# Patient Record
Sex: Female | Born: 1970 | Hispanic: No | Marital: Married | Smoking: Former smoker
Health system: Southern US, Community
[De-identification: ages and names within clinical notes are randomized; demographics above are authoritative.]

## PROBLEM LIST (undated history)

## (undated) DIAGNOSIS — J45909 Unspecified asthma, uncomplicated: Secondary | ICD-10-CM

## (undated) DIAGNOSIS — R05 Cough: Secondary | ICD-10-CM

## (undated) DIAGNOSIS — T7840XA Allergy, unspecified, initial encounter: Secondary | ICD-10-CM

## (undated) DIAGNOSIS — I10 Essential (primary) hypertension: Secondary | ICD-10-CM

## (undated) DIAGNOSIS — E785 Hyperlipidemia, unspecified: Secondary | ICD-10-CM

## (undated) DIAGNOSIS — J302 Other seasonal allergic rhinitis: Secondary | ICD-10-CM

## (undated) DIAGNOSIS — R059 Cough, unspecified: Secondary | ICD-10-CM

## (undated) HISTORY — DX: Hyperlipidemia, unspecified: E78.5

## (undated) HISTORY — PX: OTHER SURGICAL HISTORY: SHX169

## (undated) HISTORY — DX: Cough, unspecified: R05.9

## (undated) HISTORY — DX: Essential (primary) hypertension: I10

## (undated) HISTORY — DX: Allergy, unspecified, initial encounter: T78.40XA

## (undated) HISTORY — DX: Other seasonal allergic rhinitis: J30.2

## (undated) HISTORY — DX: Unspecified asthma, uncomplicated: J45.909

## (undated) HISTORY — PX: WISDOM TOOTH EXTRACTION: SHX21

## (undated) HISTORY — DX: Cough: R05

---

## 2000-07-13 ENCOUNTER — Other Ambulatory Visit: Admission: RE | Admit: 2000-07-13 | Discharge: 2000-07-13 | Payer: Self-pay | Admitting: Obstetrics and Gynecology

## 2003-06-02 ENCOUNTER — Other Ambulatory Visit: Admission: RE | Admit: 2003-06-02 | Discharge: 2003-06-02 | Payer: Self-pay | Admitting: Gynecology

## 2004-06-06 ENCOUNTER — Other Ambulatory Visit: Admission: RE | Admit: 2004-06-06 | Discharge: 2004-06-06 | Payer: Self-pay | Admitting: Gynecology

## 2005-06-19 ENCOUNTER — Other Ambulatory Visit: Admission: RE | Admit: 2005-06-19 | Discharge: 2005-06-19 | Payer: Self-pay | Admitting: Gynecology

## 2006-09-13 ENCOUNTER — Other Ambulatory Visit: Admission: RE | Admit: 2006-09-13 | Discharge: 2006-09-13 | Payer: Self-pay | Admitting: Gynecology

## 2011-01-18 ENCOUNTER — Other Ambulatory Visit (HOSPITAL_COMMUNITY): Payer: Self-pay | Admitting: Obstetrics and Gynecology

## 2011-01-18 DIAGNOSIS — N979 Female infertility, unspecified: Secondary | ICD-10-CM

## 2011-01-24 ENCOUNTER — Ambulatory Visit (HOSPITAL_COMMUNITY)
Admission: RE | Admit: 2011-01-24 | Discharge: 2011-01-24 | Disposition: A | Discharge status: Home / Self Care | Payer: BLUE CROSS/BLUE SHIELD | Source: Ambulatory Visit | Attending: Obstetrics and Gynecology | Admitting: Obstetrics and Gynecology

## 2011-01-24 DIAGNOSIS — N979 Female infertility, unspecified: Secondary | ICD-10-CM | POA: Insufficient documentation

## 2011-02-09 ENCOUNTER — Other Ambulatory Visit: Payer: Self-pay | Admitting: Dermatology

## 2011-11-16 ENCOUNTER — Other Ambulatory Visit: Payer: Self-pay | Admitting: Critical Care Medicine

## 2011-11-16 ENCOUNTER — Encounter: Payer: Self-pay | Admitting: Critical Care Medicine

## 2011-11-16 ENCOUNTER — Encounter: Payer: Self-pay | Admitting: *Deleted

## 2011-11-16 ENCOUNTER — Ambulatory Visit (INDEPENDENT_AMBULATORY_CARE_PROVIDER_SITE_OTHER): Payer: BLUE CROSS/BLUE SHIELD | Admitting: Critical Care Medicine

## 2011-11-16 DIAGNOSIS — J302 Other seasonal allergic rhinitis: Secondary | ICD-10-CM

## 2011-11-16 DIAGNOSIS — J45909 Unspecified asthma, uncomplicated: Secondary | ICD-10-CM | POA: Insufficient documentation

## 2011-11-16 DIAGNOSIS — J309 Allergic rhinitis, unspecified: Secondary | ICD-10-CM

## 2011-11-16 DIAGNOSIS — I10 Essential (primary) hypertension: Secondary | ICD-10-CM

## 2011-11-16 DIAGNOSIS — R05 Cough: Secondary | ICD-10-CM

## 2011-11-16 MED ORDER — BENZONATATE 100 MG PO CAPS: ORAL_CAPSULE | ORAL | Status: AC

## 2011-11-16 MED ORDER — PREDNISONE 10 MG PO TABS: 10.0000 mg | ORAL_TABLET | Freq: Every day | ORAL | Status: DC

## 2011-11-16 MED ORDER — PREDNISONE 10 MG PO TABS: ORAL_TABLET | ORAL | Status: DC

## 2011-11-16 MED ORDER — FLUTICASONE PROPIONATE 50 MCG/ACT NA SUSP: 2.0000 | Freq: Every day | NASAL | Status: DC

## 2011-11-16 MED ORDER — BECLOMETHASONE DIPROPIONATE 80 MCG/ACT IN AERS: 2.0000 | INHALATION_SPRAY | Freq: Two times a day (BID) | RESPIRATORY_TRACT | Status: DC

## 2011-11-16 MED ORDER — CHLORPHENIRAMINE MALEATE CR 8 MG PO CPCR: 8.0000 mg | ORAL_CAPSULE | Freq: Two times a day (BID) | ORAL | Status: AC

## 2011-11-16 NOTE — Patient Instructions (Addendum)
Start Flonase two puff each nostril daily    Start cough protocol with tessalon and delsym Increase Qvar two puff twice daily Start chlorpheniramine 8mg  nightly Follow reflux diet strictly Prednisone 10mg  Take 4 for two days three for two days two for two days one for two days and stop Allergy labs today Return 4 weeks

## 2011-11-16 NOTE — Progress Notes (Signed)
Subjective:    Patient ID: Laura Nunez, female    DOB: [birth year], 41 y.o.   MRN: [id number]  HPI Comments: Chronic cough for 8years.  Comes and goes but now more constant  Cough This is a chronic problem. The current episode started more than 1 year ago. The problem has been gradually worsening. Episode frequency: comes on randomly, worse after meals in evening. The cough is non-productive. Associated symptoms include shortness of breath. Pertinent negatives include no chest pain, chills, ear congestion, ear pain, fever, headaches, heartburn, hemoptysis, myalgias, nasal congestion, postnasal drip, rash, rhinorrhea, sore throat, sweats, weight loss or wheezing. Associated symptoms comments: Dyspneic with running as cannot breath through nose Has constant sense of clearing of the throat, and is randomly. Talks a lot on phone, inside sales, takes orders on phone all day No hoarseness Occ coughs from sleep. The symptoms are aggravated by lying down, cold air and exercise (cough is worse at night.). Risk factors for lung disease include smoking/tobacco exposure. She has tried a beta-agonist inhaler and steroid inhaler for the symptoms. The treatment provided moderate relief. Her past medical history is significant for asthma. There is no history of bronchiectasis, bronchitis, COPD, emphysema, environmental allergies or pneumonia.    Past Medical History  Diagnosis Date  . High blood pressure   . Seasonal allergies   . Cough      Family History  Problem Relation Age of Onset  . Cancer Mother   . Hypertension Father   . Diabetes Father   . Stroke Paternal Uncle   . Asthma      paternal cousin, cause of death at age 35     History   Social History  . Marital Status: Married    Spouse Name: N/A    Number of Children: 0  . Years of Education: N/A   Occupational History  . Inside Sales    Social History Main Topics  . Smoking status: Former Smoker -- 5 years    Types:  Cigarettes    Quit date: 10/09/1998  . Smokeless tobacco: Never Used   Comment: 7 ciggs a day  . Alcohol Use: Yes  . Drug Use: No  . Sexually Active: Not on file   Other Topics Concern  . Not on file   Social History Narrative  . No narrative on file     Allergies  Allergen Reactions  . Sulfa Antibiotics     Lower white blood count, rash     No outpatient prescriptions prior to visit.      Review of Systems  Constitutional: Positive for fatigue. Negative for fever, chills, weight loss, diaphoresis, activity change, appetite change and unexpected weight change.  HENT: Positive for sneezing. Negative for hearing loss, ear pain, nosebleeds, congestion, sore throat, facial swelling, rhinorrhea, mouth sores, trouble swallowing, neck pain, neck stiffness, dental problem, voice change, postnasal drip, sinus pressure, tinnitus and ear discharge.   Eyes: Negative for photophobia, discharge, itching and visual disturbance.  Respiratory: Positive for cough and shortness of breath. Negative for apnea, hemoptysis, choking, chest tightness, wheezing and stridor.   Cardiovascular: Negative for chest pain, palpitations and leg swelling.  Gastrointestinal: Negative for heartburn, nausea, vomiting, abdominal pain, constipation, blood in stool and abdominal distention.  Genitourinary: Negative for dysuria, urgency, frequency, hematuria, flank pain, decreased urine volume and difficulty urinating.  Musculoskeletal: Negative for myalgias, back pain, joint swelling, arthralgias and gait problem.  Skin: Negative for color change, pallor and rash.  Neurological: Negative for dizziness, tremors,  seizures, syncope, speech difficulty, weakness, light-headedness, numbness and headaches.  Hematological: Negative for environmental allergies and adenopathy. Bruises/bleeds easily.  Psychiatric/Behavioral: Negative for confusion, sleep disturbance and agitation. The patient is not nervous/anxious.          Objective:   Physical Exam Filed Vitals:   11/16/11 1030  BP: 156/102  Pulse: 89  Temp: 98.2 F (36.8 C)  TempSrc: Oral  Height: 5' 3.5" (1.613 m)  Weight: 160 lb (72.576 kg)  SpO2: 99%    Gen: Pleasant, well-nourished, in no distress,  normal affect  ENT: No lesions,  mouth clear,  oropharynx clear, +++ postnasal drip, turbinate edema  Neck: No JVD, no TMG, no carotid bruits  Lungs: No use of accessory muscles, no dullness to percussion, clear without rales or rhonchi  Cardiovascular: RRR, heart sounds normal, no murmur or gallops, no peripheral edema  Abdomen: soft and NT, no HSM,  BS normal  Musculoskeletal: No deformities, no cyanosis or clubbing  Neuro: alert, non focal  Skin: Warm, no lesions or rashes  CXR reported normal 10/2011  11/16/11 Spirometry: normal.  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 Prob atopic factors  Plan Start Flonase two puff each nostril daily    Start cough protocol with tessalon and delsym Increase Qvar two puff twice daily Start chlorpheniramine 8mg  nightly Follow reflux diet strictly Prednisone 10mg  Take 4 for two days three for two days two for two days one for two days and stop Allergy labs today Return 4 weeks     Updated Medication List Outpatient Encounter Prescriptions as of 11/16/2011  Medication Sig Dispense Refill  . beclomethasone (QVAR) 80 MCG/ACT inhaler Inhale 2 puffs into the lungs 2 (two) times daily.  1 Inhaler  6  . hydrochlorothiazide (HYDRODIURIL) 12.5 MG tablet Take 1 tablet by mouth Daily.      Marland Kitchen letrozole (FEMARA) 2.5 MG tablet Starts day 3 of her monthly cycle and takes for 5 days      . PROAIR HFA 108 (90 BASE) MCG/ACT inhaler Inhale 2 puffs into the lungs Ad lib.      Marland Kitchen DISCONTD: QVAR 80 MCG/ACT inhaler Inhale 2  puffs into the lungs Daily.      . benzonatate (TESSALON) 100 MG capsule Take per cyclic cough protocol 1-2 every 4 hours as needed for cough  90 capsule  4  . chlorpheniramine (CHLOR-TRIMETON) 8 MG capsule Take 1 capsule (8 mg total) by mouth 2 (two) times daily.  90 capsule  6  . fluticasone (FLONASE) 50 MCG/ACT nasal spray Place 2 sprays into the nose daily.  16 g  6  . predniSONE (DELTASONE) 10 MG tablet Take 4 for two days three for two days two for two days one for two days  20 tablet  0  . DISCONTD: predniSONE (DELTASONE) 10 MG tablet Take 1 tablet (10 mg total) by mouth daily.  10 tablet  0

## 2011-11-16 NOTE — Assessment & Plan Note (Signed)
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 Prob atopic factors  Plan Start Flonase two puff each nostril daily    Start cough protocol with tessalon and delsym Increase Qvar two puff twice daily Start chlorpheniramine 8mg  nightly Follow reflux diet strictly Prednisone 10mg  Take 4 for two days three for two days two for two days one for two days and stop Allergy labs today Return 4 weeks

## 2011-11-17 LAB — ALLERGY FULL PROFILE
Allergen,Goose feathers, e70: <0.1 kU/L (ref <0.35)
Box Elder IgE: <0.1 kU/L (ref <0.35)
Common Ragweed: <0.1 kU/L (ref <0.35)
Curvularia lunata: <0.1 kU/L (ref <0.35)
Dog Dander: 0.26 kU/L (ref <0.35)
Fescue: <0.1 kU/L (ref <0.35)
G005 Rye, Perennial: <0.1 kU/L (ref <0.35)
Goldenrod: <0.1 kU/L (ref <0.35)
Helminthosporium halodes: <0.1 kU/L (ref <0.35)
House Dust Hollister: 0.29 kU/L (ref <0.35)
IgE (Immunoglobulin E), Serum: 17.4 IU/mL (ref 0.0–180.0)
Lamb's Quarters: <0.1 kU/L (ref <0.35)
Stemphylium Botryosum: <0.1 kU/L (ref <0.35)

## 2011-11-17 NOTE — Progress Notes (Signed)
Quick Note:  Called, spoke with pt. I informed her labs are ok; no evidence of allergy reactions per PW. She verbalized understanding of this and voiced no further questions at this time. ______

## 2011-12-21 ENCOUNTER — Encounter: Payer: Self-pay | Admitting: Critical Care Medicine

## 2011-12-21 ENCOUNTER — Ambulatory Visit (INDEPENDENT_AMBULATORY_CARE_PROVIDER_SITE_OTHER): Payer: BC Managed Care – PPO | Admitting: Critical Care Medicine

## 2011-12-21 VITALS — BP 146/88 | HR 92 | Temp 98.1°F | Ht 63.5 in | Wt 159.0 lb

## 2011-12-21 DIAGNOSIS — R05 Cough: Secondary | ICD-10-CM

## 2011-12-21 MED ORDER — BECLOMETHASONE DIPROPIONATE 80 MCG/ACT IN AERS: 2.0000 | INHALATION_SPRAY | Freq: Every day | RESPIRATORY_TRACT | Status: DC

## 2011-12-21 NOTE — Progress Notes (Signed)
Subjective:    Patient ID: Laura Nunez, female    DOB: 10/11/1970, 41 y.o.   MRN: [id number]  HPI 12/21/2011 Cough is better.  When does  Cough is productive, not out.  Still wants to clear the throat. tart Flonase two puff each nostril daily    Start cough protocol with tessalon and delsym Increase Qvar two puff twice daily Start chlorpheniramine 8mg  nightly Follow reflux diet strictly Prednisone 10mg  Take 4 for two days three for two days two for two days one for two days and stop Allergy labs today  Past Medical History  Diagnosis Date  . High blood pressure   . Seasonal allergies   . Cough      Family History  Problem Relation Age of Onset  . Cancer Mother   . Hypertension Father   . Diabetes Father   . Stroke Paternal Uncle   . Asthma      paternal cousin, cause of death at age 84     History   Social History  . Marital Status: Married    Spouse Name: N/A    Number of Children: 0  . Years of Education: N/A   Occupational History  . Inside Sales    Social History Main Topics  . Smoking status: Former Smoker -- 5 years    Types: Cigarettes    Quit date: 10/09/1998  . Smokeless tobacco: Never Used   Comment: 7 ciggs a day  . Alcohol Use: Yes  . Drug Use: No  . Sexually Active: Not on file   Other Topics Concern  . Not on file   Social History Narrative  . No narrative on file     Allergies  Allergen Reactions  . Sulfa Antibiotics     Lower white blood count, rash     Outpatient Prescriptions Prior to Visit  Medication Sig Dispense Refill  . fluticasone (FLONASE) 50 MCG/ACT nasal spray Place 2 sprays into the nose daily.  16 g  6  . hydrochlorothiazide (HYDRODIURIL) 12.5 MG tablet Take 1 tablet by mouth Daily.      Marland Kitchen PROAIR HFA 108 (90 BASE) MCG/ACT inhaler Inhale 2 puffs into the lungs Ad lib.      . beclomethasone (QVAR) 80 MCG/ACT inhaler Inhale 2 puffs into the lungs 2 (two) times daily.  1 Inhaler  6  . letrozole (FEMARA) 2.5 MG  tablet Starts day 3 of her monthly cycle and takes for 5 days      . predniSONE (DELTASONE) 10 MG tablet Take 4 for two days three for two days two for two days one for two days  20 tablet  0      Review of Systems  Constitutional: Positive for fatigue. Negative for diaphoresis, activity change, appetite change and unexpected weight change.  HENT: Positive for sneezing. Negative for hearing loss, nosebleeds, congestion, facial swelling, mouth sores, trouble swallowing, neck pain, neck stiffness, dental problem, voice change, sinus pressure, tinnitus and ear discharge.   Eyes: Negative for photophobia, discharge, itching and visual disturbance.  Respiratory: Negative for apnea, choking, chest tightness and stridor.   Cardiovascular: Negative for palpitations and leg swelling.  Gastrointestinal: Negative for nausea, vomiting, abdominal pain, constipation, blood in stool and abdominal distention.  Genitourinary: Negative for dysuria, urgency, frequency, hematuria, flank pain, decreased urine volume and difficulty urinating.  Musculoskeletal: Negative for back pain, joint swelling, arthralgias and gait problem.  Skin: Negative for color change and pallor.  Neurological: Negative for dizziness, tremors, seizures, syncope,  speech difficulty, weakness, light-headedness and numbness.  Hematological: Negative for adenopathy. Bruises/bleeds easily.  Psychiatric/Behavioral: Negative for confusion, sleep disturbance and agitation. The patient is not nervous/anxious.        Objective:   Physical Exam  Filed Vitals:   12/21/11 0953  BP: 146/88  Pulse: 92  Temp: 98.1 F (36.7 C)  TempSrc: Oral  Height: 5' 3.5" (1.613 m)  Weight: 159 lb (72.122 kg)  SpO2: 99%    Gen: Pleasant, well-nourished, in no distress,  normal affect  ENT: No lesions,  mouth clear,  oropharynx clear, + postnasal drip, turbinate edema  Neck: No JVD, no TMG, no carotid bruits  Lungs: No use of accessory muscles, no  dullness to percussion, clear without rales or rhonchi  Cardiovascular: RRR, heart sounds normal, no murmur or gallops, no peripheral edema  Abdomen: soft and NT, no HSM,  BS normal  Musculoskeletal: No deformities, no cyanosis or clubbing  Neuro: alert, non focal  Skin: Warm, no lesions or rashes  CXR reported normal 10/2011  11/16/11 Spirometry: normal.  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 beclomethasone (QVAR) 80 MCG/ACT inhaler Inhale 2 puffs into the lungs daily.  1 Inhaler  6  . benzonatate (TESSALON) 100 MG capsule Take 100 mg by mouth as needed.      . fluticasone (FLONASE) 50 MCG/ACT nasal spray Place 2 sprays into the nose daily.  16 g  6  . hydrochlorothiazide (HYDRODIURIL) 12.5 MG tablet Take 1 tablet by mouth Daily.      Marland Kitchen PROAIR HFA 108 (90 BASE) MCG/ACT inhaler Inhale 2 puffs into the lungs Ad lib.      Marland Kitchen DISCONTD: beclomethasone (QVAR) 80 MCG/ACT inhaler Inhale 2 puffs into the lungs 2 (two) times daily.  1 Inhaler  6  . letrozole (FEMARA) 2.5 MG tablet Starts day 3 of her monthly cycle and takes for 5 days      . DISCONTD: benzonatate (TESSALON) 100 MG capsule       . DISCONTD: predniSONE (DELTASONE) 10 MG tablet Take 4 for two days three for two days two for two days one for two days  20 tablet  0

## 2011-12-21 NOTE — Assessment & Plan Note (Signed)
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

## 2011-12-21 NOTE — Patient Instructions (Addendum)
Reduce Qvar to once daily two puff Stay on flonase daily Tessalon as needed Voice rest when you can Continue the sugar free candy drops >>>train self to swallow not cough or clear the throat Return 4 months for recheck

## 2012-06-13 ENCOUNTER — Ambulatory Visit: Payer: BC Managed Care – PPO | Admitting: Critical Care Medicine

## 2012-06-27 ENCOUNTER — Ambulatory Visit: Payer: BC Managed Care – PPO | Admitting: Critical Care Medicine

## 2012-07-11 ENCOUNTER — Ambulatory Visit (INDEPENDENT_AMBULATORY_CARE_PROVIDER_SITE_OTHER): Payer: BC Managed Care – PPO | Admitting: Critical Care Medicine

## 2012-07-11 ENCOUNTER — Encounter: Payer: Self-pay | Admitting: Critical Care Medicine

## 2012-07-11 VITALS — BP 110/80 | HR 82 | Temp 99.1°F | Ht 63.5 in | Wt 155.0 lb

## 2012-07-11 DIAGNOSIS — R05 Cough: Secondary | ICD-10-CM

## 2012-07-11 MED ORDER — BECLOMETHASONE DIPROPIONATE 80 MCG/ACT IN AERS: 2.0000 | INHALATION_SPRAY | Freq: Every day | RESPIRATORY_TRACT | Status: AC

## 2012-07-11 MED ORDER — FLUTICASONE PROPIONATE 50 MCG/ACT NA SUSP: 2.0000 | Freq: Every day | NASAL | Status: DC

## 2012-07-11 NOTE — Progress Notes (Signed)
Subjective:    Patient ID: Laura Nunez, female    DOB: [birth year], 41 y.o.   MRN: [id number]  HPI  07/11/2012 No recurrence of cough since last ov.  Allergy flare two weeks ago and rx mucinex and augmentin. Pt is markedly better overall. Pt denies any significant sore throat, nasal congestion or excess secretions, fever, chills, sweats, unintended weight loss, pleurtic or exertional chest pain, orthopnea PND, or leg swelling Pt denies any increase in rescue therapy over baseline, denies waking up needing it or having any early am or nocturnal exacerbations of coughing/wheezing/or dyspnea. Pt also denies any obvious fluctuation in symptoms with  weather or environmental change or other alleviating or aggravating factors     Past Medical History  Diagnosis Date  . High blood pressure   . Seasonal allergies   . Cough      Family History  Problem Relation Age of Onset  . Cancer Mother   . Hypertension Father   . Diabetes Father   . Stroke Paternal Uncle   . Asthma      paternal cousin, cause of death at age 80     History   Social History  . Marital Status: Married    Spouse Name: N/A    Number of Children: 0  . Years of Education: N/A   Occupational History  . Inside Sales    Social History Main Topics  . Smoking status: Former Smoker -- 5 years    Types: Cigarettes    Quit date: 10/09/1998  . Smokeless tobacco: Never Used   Comment: 7 cigs a day  . Alcohol Use: Yes  . Drug Use: No  . Sexually Active: Not on file   Other Topics Concern  . Not on file   Social History Narrative  . No narrative on file     Allergies  Allergen Reactions  . Sulfa Antibiotics     Lower white blood count, rash     Outpatient Prescriptions Prior to Visit  Medication Sig Dispense Refill  . hydrochlorothiazide (HYDRODIURIL) 12.5 MG tablet Take 1 tablet by mouth Daily.      Marland Kitchen PROAIR HFA 108 (90 BASE) MCG/ACT inhaler Inhale 2 puffs into the lungs Ad lib.      .  beclomethasone (QVAR) 80 MCG/ACT inhaler Inhale 2 puffs into the lungs daily.  1 Inhaler  6  . fluticasone (FLONASE) 50 MCG/ACT nasal spray Place 2 sprays into the nose daily.  16 g  6  . benzonatate (TESSALON) 100 MG capsule Take 100 mg by mouth as needed.      Marland Kitchen letrozole (FEMARA) 2.5 MG tablet Starts day 3 of her monthly cycle and takes for 5 days          Review of Systems  Constitutional: Positive for fatigue. Negative for diaphoresis, activity change, appetite change and unexpected weight change.  HENT: Positive for sneezing. Negative for hearing loss, nosebleeds, congestion, facial swelling, mouth sores, trouble swallowing, neck pain, neck stiffness, dental problem, voice change, sinus pressure, tinnitus and ear discharge.   Eyes: Negative for photophobia, discharge, itching and visual disturbance.  Respiratory: Negative for apnea, choking, chest tightness and stridor.   Cardiovascular: Negative for palpitations and leg swelling.  Gastrointestinal: Negative for nausea, vomiting, abdominal pain, constipation, blood in stool and abdominal distention.  Genitourinary: Negative for dysuria, urgency, frequency, hematuria, flank pain, decreased urine volume and difficulty urinating.  Musculoskeletal: Negative for back pain, joint swelling, arthralgias and gait problem.  Skin: Negative for color  change and pallor.  Neurological: Negative for dizziness, tremors, seizures, syncope, speech difficulty, weakness, light-headedness and numbness.  Hematological: Negative for adenopathy. Bruises/bleeds easily.  Psychiatric/Behavioral: Negative for confusion, disturbed wake/sleep cycle and agitation. The patient is not nervous/anxious.        Objective:   Physical Exam  Filed Vitals:   07/11/12 1534  BP: 110/80  Pulse: 82  Temp: 99.1 F (37.3 C)  TempSrc: Oral  Height: 5' 3.5" (1.613 m)  Weight: 70.308 kg (155 lb)  SpO2: 99%    Gen: Pleasant, well-nourished, in no distress,  normal  affect  ENT: No lesions,  mouth clear,  oropharynx clear, + postnasal drip  Neck: No JVD, no TMG, no carotid bruits  Lungs: No use of accessory muscles, no dullness to percussion, clear without rales or rhonchi  Cardiovascular: RRR, heart sounds normal, no murmur or gallops, no peripheral edema  Abdomen: soft and NT, no HSM,  BS normal  Musculoskeletal: No deformities, no cyanosis or clubbing  Neuro: alert, non focal  Skin: Warm, no lesions or rashes       Assessment & Plan:   Cough Cyclic cough d/t lower airway inflammation and moderate persistent asthma, improved.  also a ppt factor is  allergic rhinitis Plan Qvar two puff once daily Flonase daily Return 6 months     Updated Medication List Outpatient Encounter Prescriptions as of 07/11/2012  Medication Sig Dispense Refill  . beclomethasone (QVAR) 80 MCG/ACT inhaler Inhale 2 puffs into the lungs daily.  1 Inhaler  11  . fluticasone (FLONASE) 50 MCG/ACT nasal spray Place 2 sprays into the nose daily.  16 g  11  . hydrochlorothiazide (HYDRODIURIL) 12.5 MG tablet Take 1 tablet by mouth Daily.      Marland Kitchen PROAIR HFA 108 (90 BASE) MCG/ACT inhaler Inhale 2 puffs into the lungs Ad lib.      Marland Kitchen DISCONTD: beclomethasone (QVAR) 80 MCG/ACT inhaler Inhale 2 puffs into the lungs daily.  1 Inhaler  6  . DISCONTD: fluticasone (FLONASE) 50 MCG/ACT nasal spray Place 2 sprays into the nose daily.  16 g  6  . DISCONTD: benzonatate (TESSALON) 100 MG capsule Take 100 mg by mouth as needed.      Marland Kitchen DISCONTD: letrozole (FEMARA) 2.5 MG tablet Starts day 3 of her monthly cycle and takes for 5 days

## 2012-07-11 NOTE — Patient Instructions (Addendum)
Qvar two puff once daily Flonase daily Return 6 months

## 2012-07-14 NOTE — Assessment & Plan Note (Signed)
Cyclic cough d/t lower airway inflammation and moderate persistent asthma, improved.  also a ppt factor is  allergic rhinitis Plan Qvar two puff once daily Flonase daily Return 6 months

## 2013-01-23 ENCOUNTER — Ambulatory Visit (INDEPENDENT_AMBULATORY_CARE_PROVIDER_SITE_OTHER): Payer: BC Managed Care – PPO | Admitting: Critical Care Medicine

## 2013-01-23 ENCOUNTER — Encounter: Payer: Self-pay | Admitting: Critical Care Medicine

## 2013-01-23 VITALS — BP 142/100 | HR 79 | Temp 98.2°F | Ht 63.5 in | Wt 160.5 lb

## 2013-01-23 DIAGNOSIS — J45909 Unspecified asthma, uncomplicated: Secondary | ICD-10-CM

## 2013-01-23 NOTE — Patient Instructions (Addendum)
Stay on Qvar and flonase You can try chlorpheniramine (chlor trimeton) 4mg - 8mg  at bedtime for drainage and then use claritin/zyrtec/allegra each AM Return 6 months

## 2013-01-23 NOTE — Progress Notes (Signed)
Subjective:    Patient ID: Laura Nunez, female    DOB: 11/22/1970, 42 y.o.   MRN: [id number]  HPI  01/23/2013 Cough is better, may have irritated when running with throat scratchy. SOme pndrip.  Pt denies any significant sore throat, nasal congestion or excess secretions, fever, chills, sweats, unintended weight loss, pleurtic or exertional chest pain, orthopnea PND, or leg swelling Pt denies any increase in rescue therapy over baseline, denies waking up needing it or having any early am or nocturnal exacerbations of coughing/wheezing/or dyspnea. Pt also denies any obvious fluctuation in symptoms with  weather or environmental change or other alleviating or aggravating factors  PUL ASTHMA HISTORY 01/23/2013  Symptoms >2 days/week  Nighttime awakenings 3-4/month  Interference with activity No limitations  SABA use 0-2 days/wk  Exacerbations requiring oral steroids 0-1 / year      Past Medical History  Diagnosis Date  . High blood pressure   . Seasonal allergies   . Cough      Family History  Problem Relation Age of Onset  . Cancer Mother   . Hypertension Father   . Diabetes Father   . Stroke Paternal Uncle   . Asthma      paternal cousin, cause of death at age 60     History   Social History  . Marital Status: Married    Spouse Name: N/A    Number of Children: 0  . Years of Education: N/A   Occupational History  . [employer]    Social History Main Topics  . Smoking status: Former Smoker -- 0.75 packs/day for 5 years    Types: Cigarettes    Quit date: 10/09/1998  . Smokeless tobacco: Never Used     Comment: 7 cigs a day  . Alcohol Use: Yes  . Drug Use: No  . Sexually Active: Not on file   Other Topics Concern  . Not on file   Social History Narrative  . No narrative on file     Allergies  Allergen Reactions  . Sulfa Antibiotics     Lower white blood count, rash     Outpatient Prescriptions Prior to Visit  Medication Sig Dispense Refill   . beclomethasone (QVAR) 80 MCG/ACT inhaler Inhale 2 puffs into the lungs daily.  1 Inhaler  11  . fluticasone (FLONASE) 50 MCG/ACT nasal spray Place 2 sprays into the nose daily.  16 g  11  . hydrochlorothiazide (HYDRODIURIL) 12.5 MG tablet Take 1 tablet by mouth Daily.      Marland Kitchen PROAIR HFA 108 (90 BASE) MCG/ACT inhaler Inhale 2 puffs into the lungs Ad lib.       No facility-administered medications prior to visit.      Review of Systems  Constitutional: Positive for fatigue. Negative for diaphoresis, activity change, appetite change and unexpected weight change.  HENT: Positive for sneezing. Negative for hearing loss, nosebleeds, congestion, facial swelling, mouth sores, trouble swallowing, neck pain, neck stiffness, dental problem, voice change, sinus pressure, tinnitus and ear discharge.   Eyes: Negative for photophobia, discharge, itching and visual disturbance.  Respiratory: Negative for apnea, choking, chest tightness and stridor.   Cardiovascular: Negative for palpitations and leg swelling.  Gastrointestinal: Negative for nausea, vomiting, abdominal pain, constipation, blood in stool and abdominal distention.  Genitourinary: Negative for dysuria, urgency, frequency, hematuria, flank pain, decreased urine volume and difficulty urinating.  Musculoskeletal: Negative for back pain, joint swelling, arthralgias and gait problem.  Skin: Negative for color change and pallor.  Neurological: Negative for dizziness, tremors, seizures, syncope, speech difficulty, weakness, light-headedness and numbness.  Hematological: Negative for adenopathy. Bruises/bleeds easily.  Psychiatric/Behavioral: Negative for confusion, sleep disturbance and agitation. The patient is not nervous/anxious.        Objective:   Physical Exam  Filed Vitals:   01/23/13 0950  BP: 142/100  Pulse: 79  Temp: 98.2 F (36.8 C)  TempSrc: Oral  Height: 5' 3.5" (1.613 m)  Weight: 160 lb 8 oz (72.802 kg)  SpO2: 99%     Gen: Pleasant, well-nourished, in no distress,  normal affect  ENT: No lesions,  mouth clear,  oropharynx clear, + postnasal drip  Neck: No JVD, no TMG, no carotid bruits  Lungs: No use of accessory muscles, no dullness to percussion, clear without rales or rhonchi  Cardiovascular: RRR, heart sounds normal, no murmur or gallops, no peripheral edema  Abdomen: soft and NT, no HSM,  BS normal  Musculoskeletal: No deformities, no cyanosis or clubbing  Neuro: alert, non focal  Skin: Warm, no lesions or rashes     Assessment & Plan:   Moderate persistent asthma with atopic features Moderate persistent asthma with significant allergic features and cyclical cough now improved Plan Stay on Qvar and flonase  chlorpheniramine (chlor trimeton) 4mg - 8mg  at bedtime for drainage and then use claritin/zyrtec/allegra each AM Return 6 months     Updated Medication List Outpatient Encounter Prescriptions as of 01/23/2013  Medication Sig Dispense Refill  . beclomethasone (QVAR) 80 MCG/ACT inhaler Inhale 2 puffs into the lungs daily.  1 Inhaler  11  . fluticasone (FLONASE) 50 MCG/ACT nasal spray Place 2 sprays into the nose daily.  16 g  11  . hydrochlorothiazide (HYDRODIURIL) 12.5 MG tablet Take 1 tablet by mouth Daily.      Marland Kitchen loratadine (CLARITIN) 10 MG tablet Take 10 mg by mouth daily as needed.       Marland Kitchen PROAIR HFA 108 (90 BASE) MCG/ACT inhaler Inhale 2 puffs into the lungs Ad lib.       No facility-administered encounter medications on file as of 01/23/2013.

## 2013-01-23 NOTE — Assessment & Plan Note (Signed)
Moderate persistent asthma with significant allergic features and cyclical cough now improved Plan Stay on Qvar and flonase  chlorpheniramine (chlor trimeton) 4mg - 8mg  at bedtime for drainage and then use claritin/zyrtec/allegra each AM Return 6 months

## 2013-07-16 ENCOUNTER — Other Ambulatory Visit: Payer: Self-pay | Admitting: *Deleted

## 2013-07-16 MED ORDER — FLUTICASONE PROPIONATE 50 MCG/ACT NA SUSP: 2.0000 | Freq: Every day | NASAL | Status: DC

## 2013-08-11 ENCOUNTER — Telehealth: Payer: Self-pay | Admitting: Critical Care Medicine

## 2013-08-11 NOTE — Telephone Encounter (Signed)
Called Patient to set up follow up apt, Left message x3. No return call back. Sent letter 08/11/13  ° °

## 2013-08-31 ENCOUNTER — Other Ambulatory Visit: Payer: Self-pay | Admitting: Critical Care Medicine

## 2013-09-01 NOTE — Telephone Encounter (Signed)
Flonase rx sent to [facility] on 07/16/13 # 16g x 4. [employer], spoke with [place].  They did receive the rx in October. Nothing further needed from our office at this time.

## 2015-10-24 ENCOUNTER — Emergency Department (HOSPITAL_COMMUNITY)
Admission: EM | Admit: 2015-10-24 | Discharge: 2015-10-24 | Disposition: A | Discharge status: Home / Self Care | Payer: BLUE CROSS/BLUE SHIELD | Attending: Emergency Medicine | Admitting: Emergency Medicine

## 2015-10-24 ENCOUNTER — Encounter (HOSPITAL_COMMUNITY): Payer: Self-pay | Admitting: Emergency Medicine

## 2015-10-24 ENCOUNTER — Emergency Department (HOSPITAL_COMMUNITY): Payer: BLUE CROSS/BLUE SHIELD

## 2015-10-24 DIAGNOSIS — Z8709 Personal history of other diseases of the respiratory system: Secondary | ICD-10-CM | POA: Insufficient documentation

## 2015-10-24 DIAGNOSIS — Z7952 Long term (current) use of systemic steroids: Secondary | ICD-10-CM | POA: Insufficient documentation

## 2015-10-24 DIAGNOSIS — Z87891 Personal history of nicotine dependence: Secondary | ICD-10-CM | POA: Insufficient documentation

## 2015-10-24 DIAGNOSIS — S9031XA Contusion of right foot, initial encounter: Secondary | ICD-10-CM | POA: Diagnosis not present

## 2015-10-24 DIAGNOSIS — Y92009 Unspecified place in unspecified non-institutional (private) residence as the place of occurrence of the external cause: Secondary | ICD-10-CM | POA: Diagnosis not present

## 2015-10-24 DIAGNOSIS — Y9389 Activity, other specified: Secondary | ICD-10-CM | POA: Insufficient documentation

## 2015-10-24 DIAGNOSIS — Z7951 Long term (current) use of inhaled steroids: Secondary | ICD-10-CM | POA: Insufficient documentation

## 2015-10-24 DIAGNOSIS — Y999 Unspecified external cause status: Secondary | ICD-10-CM | POA: Diagnosis not present

## 2015-10-24 DIAGNOSIS — I10 Essential (primary) hypertension: Secondary | ICD-10-CM | POA: Diagnosis not present

## 2015-10-24 DIAGNOSIS — S99911A Unspecified injury of right ankle, initial encounter: Secondary | ICD-10-CM | POA: Diagnosis not present

## 2015-10-24 DIAGNOSIS — W500XXA Accidental hit or strike by another person, initial encounter: Secondary | ICD-10-CM | POA: Insufficient documentation

## 2015-10-24 DIAGNOSIS — Z79899 Other long term (current) drug therapy: Secondary | ICD-10-CM | POA: Insufficient documentation

## 2015-10-24 DIAGNOSIS — M79671 Pain in right foot: Secondary | ICD-10-CM

## 2015-10-24 NOTE — Discharge Instructions (Signed)
You were evaluated in the ED today for your right foot pain. Your x-ray was negative for any broken bones or dislocations. You are likely experiencing a sprain. You may continue using Motrin for your swelling and discomfort. You may also use an Ace wrap while active. Follow-up with your doctor as needed for reevaluation. Return to ED for any new or worsening symptoms.

## 2015-10-24 NOTE — ED Notes (Signed)
Pt reports that visitor was drinking last night and fell onto her right foot causing pain to foot and ankle.

## 2015-10-24 NOTE — ED Provider Notes (Signed)
CSN: [id number]     Arrival date & time 10/24/15  [id number] History  By signing my name below, I, Irene Pap, attest that this documentation has been prepared under the direction and in the presence of [employer], PA-C. Electronically Signed: Irene Pap, ED Scribe. 10/24/2015. 11:46 AM.   Chief Complaint  Patient presents with  . Foot Injury  . Ankle Injury   The history is provided by the patient. No language interpreter was used.  HPI Comments: Laura Nunez is a 45 y.o. Female with a hx of HTN who presents to the Emergency Department complaining of a right ankle injury onset one day ago. Pt states that she and her husband were drinking at the house when he fell backwards and landed on the anterior portion of her right lower leg. She reports worsening pain with movement and palpation of the medial portion of the right ankle. She reports associated swelling. Pt has not taken anything for her pain. She denies knee pain, numbness or weakness. Pt denies use of blood thinners.   Past Medical History  Diagnosis Date  . High blood pressure   . Seasonal allergies   . Cough    Past Surgical History  Procedure Laterality Date  . Wisdom tooth extraction     Family History  Problem Relation Age of Onset  . Cancer Mother   . Hypertension Father   . Diabetes Father   . Stroke Paternal Uncle   . Asthma      paternal cousin, cause of death at age 91   Social History  Substance Use Topics  . Smoking status: Former Smoker -- 0.75 packs/day for 5 years    Types: Cigarettes    Quit date: 10/09/1998  . Smokeless tobacco: Never Used     Comment: 7 cigs a day  . Alcohol Use: Yes   OB History    No data available     Review of Systems 10 Systems reviewed and all are negative for acute change except as noted in the HPI.  Allergies  Sulfa antibiotics  Home Medications   Prior to Admission medications   Medication Sig Start Date End Date Taking? Authorizing Provider   beclomethasone (QVAR) 80 MCG/ACT inhaler Inhale 2 puffs into the lungs daily. 07/11/12   Elsie Stain, MD  fluticasone (FLONASE) 50 MCG/ACT nasal spray Place 2 sprays into the nose daily. 07/16/13 07/16/14  Elsie Stain, MD  hydrochlorothiazide (HYDRODIURIL) 12.5 MG tablet Take 1 tablet by mouth Daily. 10/26/11   Historical Provider, MD  loratadine (CLARITIN) 10 MG tablet Take 10 mg by mouth daily as needed.     Historical Provider, MD  PROAIR HFA 108 (90 BASE) MCG/ACT inhaler Inhale 2 puffs into the lungs Ad lib. 10/26/11   Historical Provider, MD   BP 128/61 mmHg  Pulse 97  Temp(Src) 98.2 F (36.8 C) (Oral)  Resp 18  Ht 5' 3.5" (1.613 m)  Wt 70.308 kg  BMI 27.02 kg/m2  SpO2 100%  LMP 10/19/2015 Physical Exam  Constitutional: She is oriented to person, place, and time. She appears well-developed and well-nourished.  HENT:  Head: Normocephalic and atraumatic.  Eyes: EOM are normal. Pupils are equal, round, and reactive to light.  Neck: Normal range of motion. Neck supple.  Cardiovascular: Normal rate and regular rhythm.   Pulmonary/Chest: Effort normal and breath sounds normal.  Musculoskeletal:  Right foot: tenderness to posterior aspect of medial malleolus with no obvious deformity; no erythema; diffuse tenderness to dorsal aspect  of mid foot with some ecchymosis; distal pulses intact; brisk cap refill; moves all digits without difficulty; sensation intact to light touch; no other deformities or abnormalities noted; Full ROM of right knee, no tenderness at joint lines or fibular head  Neurological: She is alert and oriented to person, place, and time.  Skin: Skin is warm and dry.  Psychiatric: She has a normal mood and affect. Her behavior is normal.  Nursing note and vitals reviewed.   ED Course  Procedures (including critical care time) DIAGNOSTIC STUDIES: Oxygen Saturation is 100% on RA, normal by my interpretation.    COORDINATION OF CARE: 9:49 AM-Discussed treatment  plan which includes x-ray and RICE therapy with pt at bedside and pt agreed to plan.   Labs Review Labs Reviewed - No data to display  Imaging Review Dg Ankle Complete Right  10/24/2015  CLINICAL DATA:  Right ankle pain and swelling since last night. Someone fell on ankle. EXAM: RIGHT ANKLE - COMPLETE 3+ VIEW COMPARISON:  None. FINDINGS: Mild diffuse soft tissue swelling over the ankle. No evidence of acute fracture or dislocation. Remainder of the exam is within normal. IMPRESSION: No acute fracture. Electronically Signed   By: Marin Olp M.D.   On: 10/24/2015 11:28   I have personally reviewed and evaluated these images and lab results as part of my medical decision-making.   EKG Interpretation None     Filed Vitals:   10/24/15 0946 10/24/15 1150  BP: 153/93 128/61  Pulse: 118 97  Temp: 98.4 F (36.9 C) 98.2 F (36.8 C)  Resp: 18 18   Meds given in ED:  Medications - No data to display  Discharge Medication List as of 10/24/2015 11:45 AM      MDM  Patient presents for evaluation of right foot pain after her husband fell on it. Patient X-Ray negative for obvious fracture or dislocation. Patient refuses pain medication in the ED. Remains neurovascularly intact, no evidence of compartment syndrome. Pt advised to follow up with orthopedics if symptoms persist for possibility of missed fracture diagnosis. Patient given brace while in ED, conservative therapy recommended and discussed. Patient will be dc home & is agreeable with above plan.  Final diagnoses:  Right foot pain   I personally performed the services described in this documentation, which was scribed in my presence. The recorded information has been reviewed and is accurate.    Comer Locket, PA-C 10/24/15 1409  Milton Ferguson, MD 10/26/15 [phone]

## 2017-08-10 ENCOUNTER — Other Ambulatory Visit: Payer: Self-pay | Admitting: Obstetrics and Gynecology

## 2017-08-10 DIAGNOSIS — R928 Other abnormal and inconclusive findings on diagnostic imaging of breast: Secondary | ICD-10-CM

## 2017-08-17 ENCOUNTER — Ambulatory Visit
Admission: RE | Admit: 2017-08-17 | Discharge: 2017-08-17 | Disposition: A | Discharge status: Home / Self Care | Payer: BLUE CROSS/BLUE SHIELD | Source: Ambulatory Visit | Attending: Obstetrics and Gynecology | Admitting: Obstetrics and Gynecology

## 2017-08-17 DIAGNOSIS — R928 Other abnormal and inconclusive findings on diagnostic imaging of breast: Secondary | ICD-10-CM

## 2018-04-14 IMAGING — MG 2D DIGITAL DIAGNOSTIC UNILATERAL LEFT MAMMOGRAM WITH CAD AND ADJ
6 series · 6 of 14 positions shown · non-contrast
Comparison: Previous exams including recent screening mammogram
dated 08/09/2017.

CLINICAL DATA: Patient returns today to evaluate a possible left
breast asymmetry questioned on recent screening mammogram.

EXAM:
2D DIGITAL DIAGNOSTIC LEFT MAMMOGRAM WITH CAD AND ADJUNCT TOMO
ULTRASOUND LEFT BREAST

[L CC]
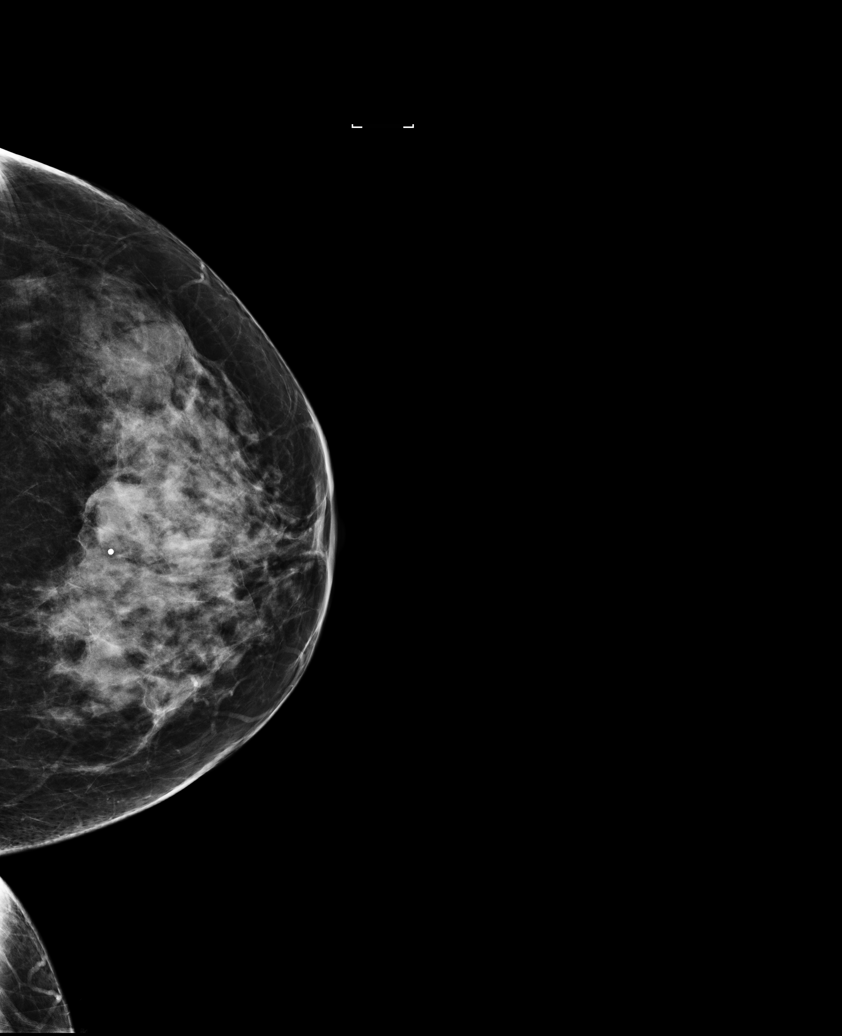

[L MLO synth-2D]
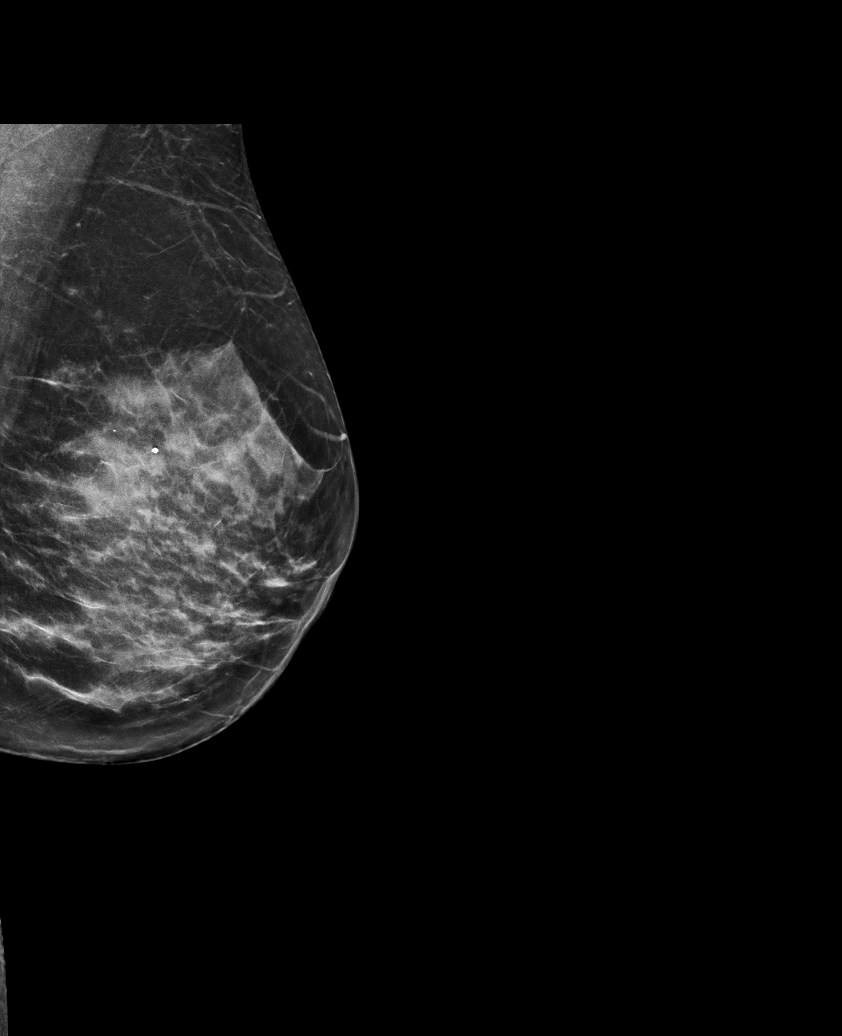

[L CC synth-2D]
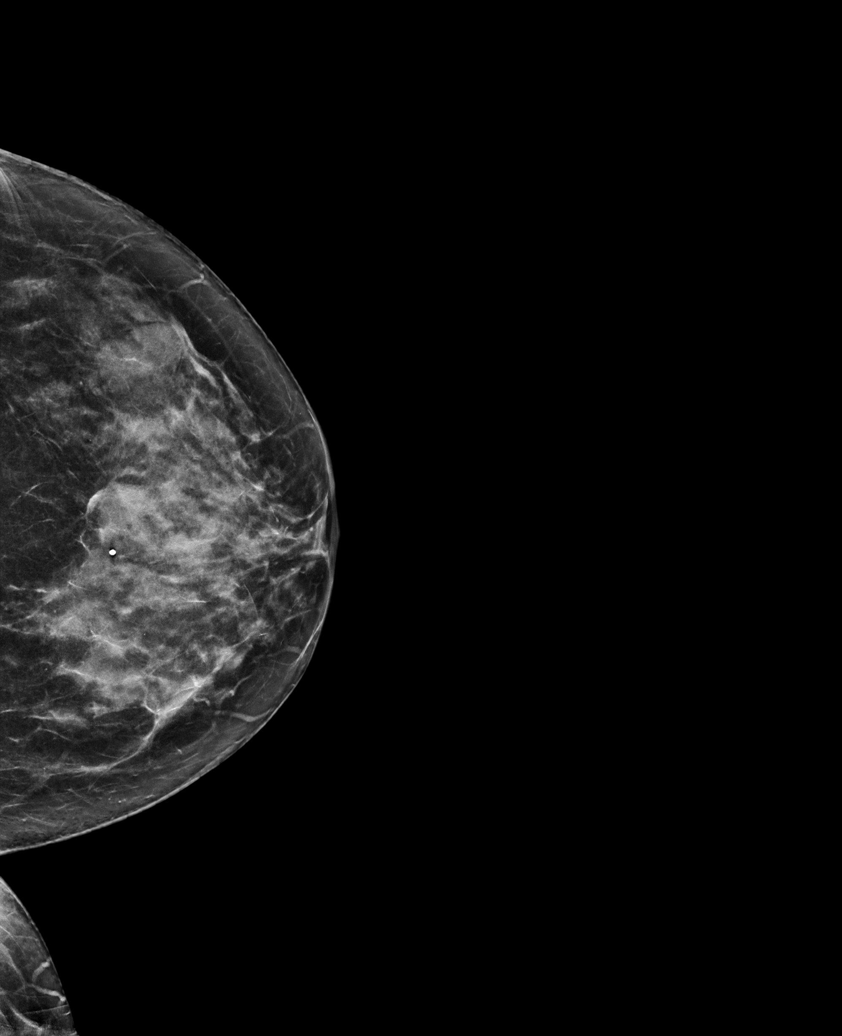

[L MLO]
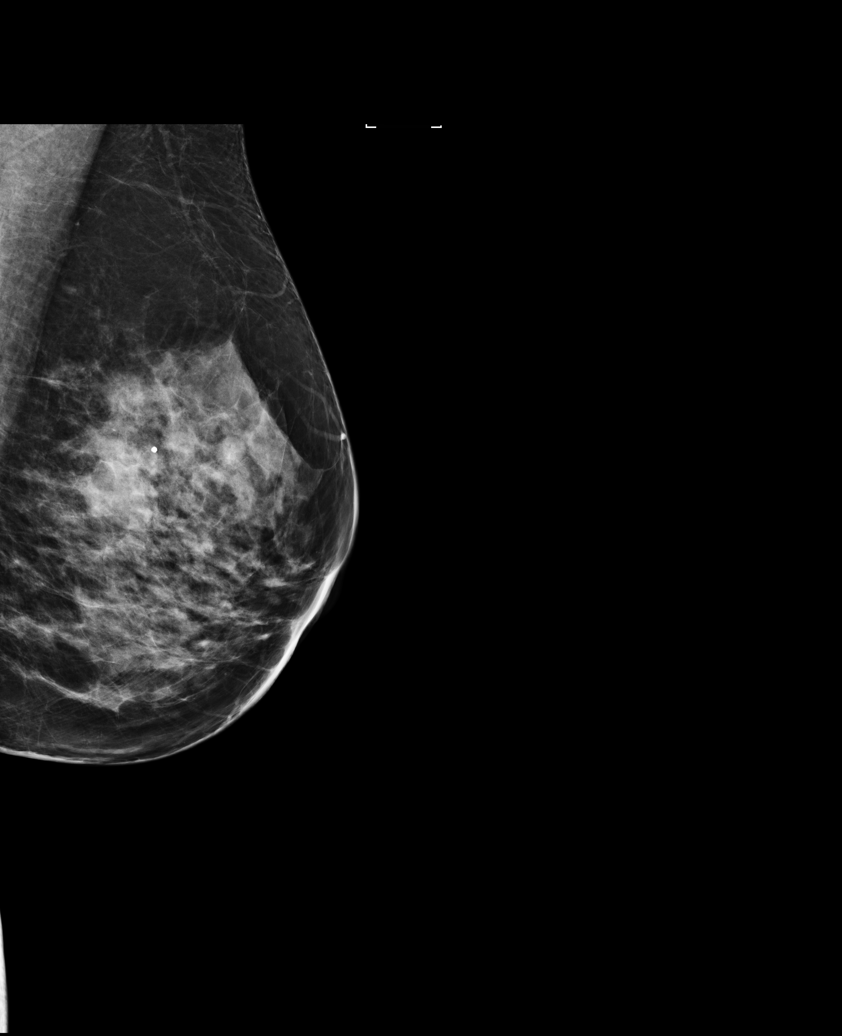

[L CC tomo · tomo slice 31/62.0]
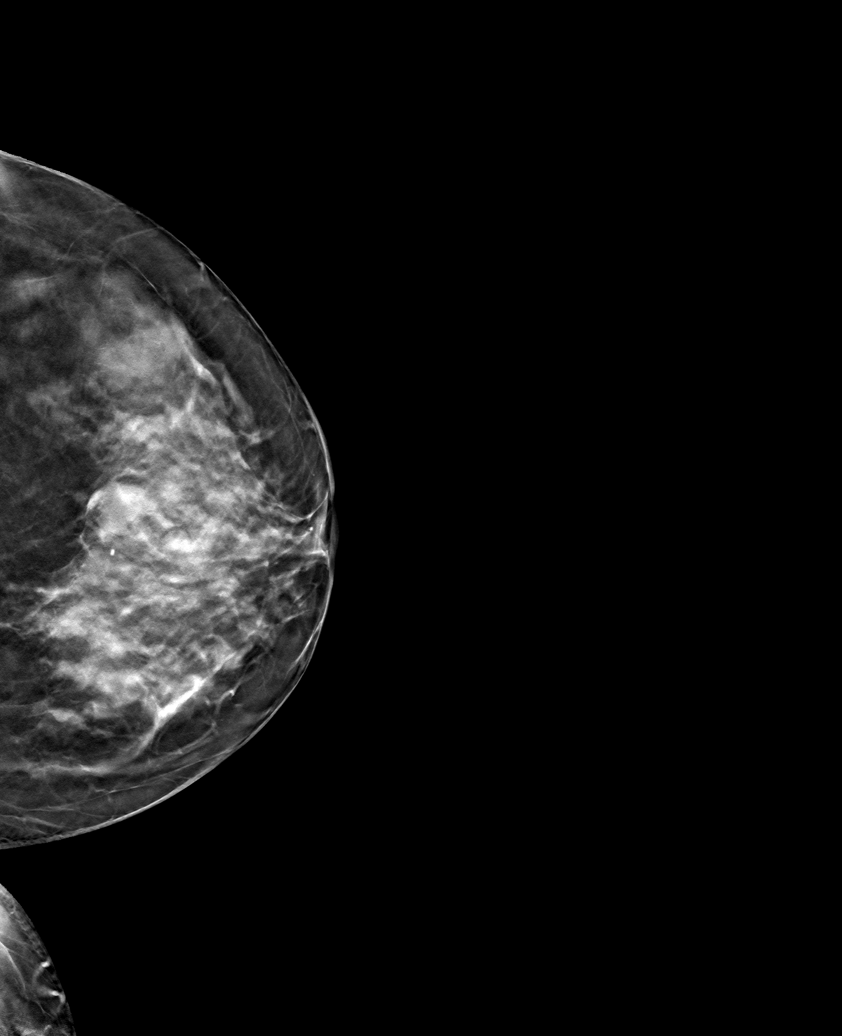

[L MLO tomo · tomo slice 33/65.0]
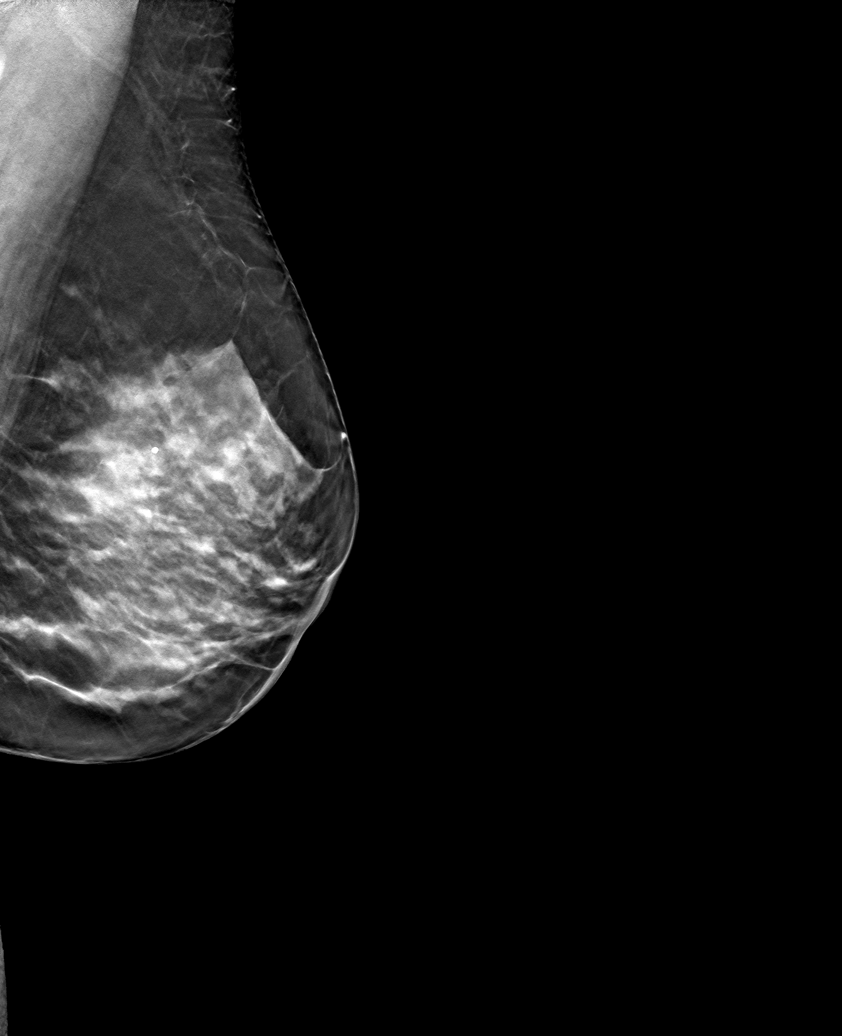

[6 of 14 positions shown; findings below may reference images not displayed]

ACR Breast Density Category c: The breast tissue is heterogeneously
dense, which may obscure small masses.
FINDINGS: On today's additional views with 3D tomosynthesis, a partially
obscured mass is confirmed within the slightly outer left breast, 3
o'clock axis region, at posterior depth, measuring approximately 2
cm.

Mammographic images were processed with CAD.

Targeted ultrasound is performed, showing a benign cyst in the left
breast at the 3 o'clock axis, 4 cm from the nipple, measuring 2.1 x
1.5 x 1.7 cm, avascular, corresponding to the mammographic finding.
Additional smaller cyst is seen immediately adjacent to the dominant
cyst, measuring 5 mm. No suspicious solid or cystic mass is
identified.
IMPRESSION: Benign cyst within the left breast at the 3 o'clock axis, measuring
2.1 cm, corresponding to the mammographic finding. No evidence of
malignancy.

Patient may return to routine annual bilateral screening mammogram
schedule.

RECOMMENDATION:
Screening mammogram in one year.(Code:A7-V-DWT)

I have discussed the findings and recommendations with the patient.
Results were also provided in writing at the conclusion of the
visit. If applicable, a reminder letter will be sent to the patient
regarding the next appointment.

BI-RADS CATEGORY  2: Benign.

## 2021-08-05 ENCOUNTER — Encounter: Payer: Self-pay | Admitting: Internal Medicine

## 2021-09-27 ENCOUNTER — Ambulatory Visit (AMBULATORY_SURGERY_CENTER): Payer: BC Managed Care – PPO | Admitting: *Deleted

## 2021-09-27 ENCOUNTER — Other Ambulatory Visit: Payer: Self-pay

## 2021-09-27 VITALS — Ht 64.0 in | Wt 158.0 lb

## 2021-09-27 DIAGNOSIS — Z1211 Encounter for screening for malignant neoplasm of colon: Secondary | ICD-10-CM

## 2021-09-27 NOTE — Progress Notes (Signed)
No egg or soy allergy known to patient  No issues known to pt with past sedation with any surgeries or procedures Patient denies ever being told they had issues or difficulty with intubation  No FH of Malignant Hyperthermia Pt is not on diet pills Pt is not on  home 02  Pt is not on blood thinners  Pt denies issues with constipation  No A fib or A flutter  Pt is fully vaccinated  for Covid     NO PA's for preps discussed with pt In PV today  Discussed with pt there will be an out-of-pocket cost for prep and that varies from $0 to 70 +  dollars - pt verbalized understanding   Due to the COVID-19 pandemic we are asking patients to follow certain guidelines in PV and the [facility]   Pt aware of COVID protocols and [facility] guidelines   PV completed over the phone. Pt verified name, DOB, address and insurance during PV today.  Pt mailed instruction packet with copy of consent form to read and not return, and instructions.   Pt encouraged to call with questions or issues.  If pt has My chart, procedure instructions sent via My Chart

## 2021-10-12 ENCOUNTER — Encounter: Payer: Self-pay | Admitting: Internal Medicine

## 2021-10-14 ENCOUNTER — Other Ambulatory Visit: Payer: Self-pay

## 2021-10-14 ENCOUNTER — Ambulatory Visit (AMBULATORY_SURGERY_CENTER): Payer: BC Managed Care – PPO | Admitting: Internal Medicine

## 2021-10-14 ENCOUNTER — Encounter: Payer: Self-pay | Admitting: Internal Medicine

## 2021-10-14 VITALS — BP 141/91 | HR 76 | Temp 98.2°F | Resp 12 | Ht 63.5 in | Wt 156.0 lb

## 2021-10-14 DIAGNOSIS — Z1211 Encounter for screening for malignant neoplasm of colon: Secondary | ICD-10-CM

## 2021-10-14 DIAGNOSIS — K635 Polyp of colon: Secondary | ICD-10-CM

## 2021-10-14 DIAGNOSIS — D120 Benign neoplasm of cecum: Secondary | ICD-10-CM

## 2021-10-14 MED ORDER — SODIUM CHLORIDE 0.9 % IV SOLN: 500.0000 mL | Freq: Once | INTRAVENOUS | Status: DC

## 2021-10-14 NOTE — Progress Notes (Signed)
To PACU, VSS. Report to [email or web address] 

## 2021-10-14 NOTE — Progress Notes (Signed)
Called to room to assist during endoscopic procedure.  Patient ID and intended procedure confirmed with present staff. Received instructions for my participation in the procedure from the performing physician.  

## 2021-10-14 NOTE — Patient Instructions (Addendum)
I found and removed a polyp - tiny. Hemorrhoids were a bit swollen - common after prep.  I will let you know pathology results and when to have another routine colonoscopy by mail and/or My Chart.  I appreciate the opportunity to care for you. Gatha Mayer, MD, FACG  YOU HAD AN ENDOSCOPIC PROCEDURE TODAY AT [facility]:   Refer to the procedure report that was given to you for any specific questions about what was found during the examination.  If the procedure report does not answer your questions, please call your gastroenterologist to clarify.  If you requested that your care partner not be given the details of your procedure findings, then the procedure report has been included in a sealed envelope for you to review at your convenience later.  YOU SHOULD EXPECT: Some feelings of bloating in the abdomen. Passage of more gas than usual.  Walking can help get rid of the air that was put into your GI tract during the procedure and reduce the bloating. If you had a lower endoscopy (such as a colonoscopy or flexible sigmoidoscopy) you may notice spotting of blood in your stool or on the toilet paper. If you underwent a bowel prep for your procedure, you may not have a normal bowel movement for a few days.  Please Note:  You might notice some irritation and congestion in your nose or some drainage.  This is from the oxygen used during your procedure.  There is no need for concern and it should clear up in a day or so.  SYMPTOMS TO REPORT IMMEDIATELY:  Following lower endoscopy (colonoscopy or flexible sigmoidoscopy):  Excessive amounts of blood in the stool  Significant tenderness or worsening of abdominal pains  Swelling of the abdomen that is new, acute  Fever of 100F or higher   For urgent or emergent issues, a gastroenterologist can be reached at any hour by calling [phone]. Do not use MyChart messaging for urgent concerns.    DIET:  We do recommend a small  meal at first, but then you may proceed to your regular diet.  Drink plenty of fluids but you should avoid alcoholic beverages for 24 hours.  ACTIVITY:  You should plan to take it easy for the rest of today and you should NOT DRIVE or use heavy machinery until tomorrow (because of the sedation medicines used during the test).    FOLLOW UP: Our staff will call the number listed on your records 48-72 hours following your procedure to check on you and address any questions or concerns that you may have regarding the information given to you following your procedure. If we do not reach you, we will leave a message.  We will attempt to reach you two times.  During this call, we will ask if you have developed any symptoms of COVID 19. If you develop any symptoms (ie: fever, flu-like symptoms, shortness of breath, cough etc.) before then, please call [phone].  If you test positive for Covid 19 in the 2 weeks post procedure, please call and report this information to [place].    If any biopsies were taken you will be contacted by phone or by letter within the next 1-3 weeks.  Please call us at [phone] if you have not heard about the biopsies in 3 weeks.    SIGNATURES/CONFIDENTIALITY: You and/or your care partner have signed paperwork which will be entered into your electronic medical record.  These signatures attest to the  fact that that the information above on your After Visit Summary has been reviewed and is understood.  Full responsibility of the confidentiality of this discharge information lies with you and/or your care-partner.

## 2021-10-14 NOTE — Op Note (Signed)
[facility] Patient Name: Laura Nunez Procedure Date: 10/14/2021 8:45 AM MRN: [id number] Endoscopist: Gatha Mayer , MD Age: 51 Referring MD:  Date of Birth: 02-25-1971 Gender: Female Account #: [id number] Procedure:                Colonoscopy Indications:              Screening for colorectal malignant neoplasm, This                            is the patient's first colonoscopy Medicines:                Propofol per Anesthesia, Monitored Anesthesia Care Procedure:                Pre-Anesthesia Assessment:                           - Prior to the procedure, a History and Physical                            was performed, and patient medications and                            allergies were reviewed. The patient's tolerance of                            previous anesthesia was also reviewed. The risks                            and benefits of the procedure and the sedation                            options and risks were discussed with the patient.                            All questions were answered, and informed consent                            was obtained. Prior Anticoagulants: The patient has                            taken no previous anticoagulant or antiplatelet                            agents. ASA Grade Assessment: II - A patient with                            mild systemic disease. After reviewing the risks                            and benefits, the patient was deemed in                            satisfactory condition to undergo the procedure.  After obtaining informed consent, the colonoscope                            was passed under direct vision. Throughout the                            procedure, the patient's blood pressure, pulse, and                            oxygen saturations were monitored continuously. The                            Olympus PCF-H190DL (#[id number]) Colonoscope was                             introduced through the anus and advanced to the the                            cecum, identified by appendiceal orifice and                            ileocecal valve. The colonoscopy was performed                            without difficulty. The patient tolerated the                            procedure well. The quality of the bowel                            preparation was good. The ileocecal valve,                            appendiceal orifice, and rectum were photographed.                            The bowel preparation used was Miralax via split                            dose instruction. Scope In: 9:01:38 AM Scope Out: 9:15:38 AM Scope Withdrawal Time: 0 hours 12 minutes 0 seconds  Total Procedure Duration: 0 hours 14 minutes 0 seconds  Findings:                 The perianal and digital rectal examinations were                            normal.                           A 1 mm polyp was found in the cecum. The polyp was                            sessile. The polyp was removed with a cold biopsy  forceps. Resection and retrieval were complete.                            Verification of patient identification for the                            specimen was done. Estimated blood loss was minimal.                           External and internal hemorrhoids were found.                           The exam was otherwise without abnormality on                            direct and retroflexion views. Complications:            No immediate complications. Estimated Blood Loss:     Estimated blood loss was minimal. Impression:               - One 1 mm polyp in the cecum, removed with a cold                            biopsy forceps. Resected and retrieved.                           - External and internal hemorrhoids.                           - The examination was otherwise normal on direct                            and retroflexion views. Recommendation:            - Patient has a contact number available for                            emergencies. The signs and symptoms of potential                            delayed complications were discussed with the                            patient. Return to normal activities tomorrow.                            Written discharge instructions were provided to the                            patient.                           - Resume previous diet.                           - Continue present medications.                           -  Repeat colonoscopy is recommended. The                            colonoscopy date will be determined after pathology                            results from today's exam become available for                            review. Gatha Mayer, MD 10/14/2021 9:22:47 AM This report has been signed electronically.

## 2021-10-14 NOTE — Progress Notes (Signed)
[facility] History and Physical   Primary Care Physician:  Heywood Bene, PA-C   Reason for Procedure:   Colon cancer screening  Plan:    colonoscopy     HPI: Laura Nunez is a 51 y.o. female here for a screening colonoscopy   Past Medical History:  Diagnosis Date   Allergy    Asthma    mild- only   Cough    clears throat alot   High blood pressure    Hyperlipidemia    Seasonal allergies     Past Surgical History:  Procedure Laterality Date   moles removed     x 2   WISDOM TOOTH EXTRACTION      Prior to Admission medications   Medication Sig Start Date End Date Taking? Authorizing Provider  atorvastatin (LIPITOR) 10 MG tablet Take 10 mg by mouth daily. 09/22/21  Yes [provider]  hydrochlorothiazide (HYDRODIURIL) 12.5 MG tablet Take 1 tablet by mouth Daily. 10/26/11  Yes [provider]  losartan (COZAAR) 100 MG tablet Take 100 mg by mouth daily. 09/22/21  Yes [provider]  mupirocin ointment (BACTROBAN) 2 % SMARTSIG:1 Application Topical 2-3 Times Daily 07/19/21  Yes [provider]  Omega-3 Fatty Acids (FISH OIL) 1200 MG CAPS Take 2 capsules by mouth daily.   Yes [provider]  triamcinolone cream (KENALOG) 0.1 % SMARTSIG:1 Application Topical 2-3 Times Daily 07/19/21  Yes [provider]  beclomethasone (QVAR) 80 MCG/ACT inhaler Inhale 2 puffs into the lungs daily. 07/11/12   Elsie Stain, MD  loratadine (CLARITIN) 10 MG tablet Take 10 mg by mouth daily as needed.     [provider]  PROAIR HFA 108 (90 BASE) MCG/ACT inhaler Inhale 2 puffs into the lungs Ad lib. Patient not taking: Reported on 09/27/2021 10/26/11   [provider]    Current Outpatient Medications  Medication Sig Dispense Refill   atorvastatin (LIPITOR) 10 MG tablet Take 10 mg by mouth daily.     hydrochlorothiazide (HYDRODIURIL) 12.5 MG tablet Take 1 tablet by mouth Daily.      losartan (COZAAR) 100 MG tablet Take 100 mg by mouth daily.     mupirocin ointment (BACTROBAN) 2 % SMARTSIG:1 Application Topical 2-3 Times Daily     Omega-3 Fatty Acids (FISH OIL) 1200 MG CAPS Take 2 capsules by mouth daily.     triamcinolone cream (KENALOG) 0.1 % SMARTSIG:1 Application Topical 2-3 Times Daily     beclomethasone (QVAR) 80 MCG/ACT inhaler Inhale 2 puffs into the lungs daily. 1 Inhaler 11   loratadine (CLARITIN) 10 MG tablet Take 10 mg by mouth daily as needed.      PROAIR HFA 108 (90 BASE) MCG/ACT inhaler Inhale 2 puffs into the lungs Ad lib. (Patient not taking: Reported on 09/27/2021)     Current Facility-Administered Medications  Medication Dose Route Frequency Provider Last Rate Last Admin   0.9 %  sodium chloride infusion  500 mL Intravenous Once Gatha Mayer, MD        Allergies as of 10/14/2021 - Review Complete 10/14/2021  Allergen Reaction Noted   Sulfa antibiotics  11/16/2011   Tape Rash 09/27/2021    Family History  Problem Relation Age of Onset   Cancer Mother    Hypertension Father    Diabetes Father    Stroke Paternal Uncle    Asthma Other        paternal cousin, cause of death at age 29   Colon cancer  Neg Hx    Colon polyps Neg Hx    Esophageal cancer Neg Hx    Rectal cancer Neg Hx    Stomach cancer Neg Hx     Social History   Socioeconomic History   Marital status: Married    Spouse name: Not on file   Number of children: 0   Years of education: Not on file   Highest education level: Not on file  Occupational History   Occupation: Inside Scientist, clinical (histocompatibility and immunogenetics): [employer]  Tobacco Use   Smoking status: Former    Packs/day: 0.75    Years: 5.00    Pack years: 3.75    Types: Cigarettes    Quit date: 10/09/1998    Years since quitting: 23.0   Smokeless tobacco: Never   Tobacco comments:    7 cigs a day  Substance and Sexual Activity   Alcohol use: Yes   Drug use: No     Review of Systems:  All other review of systems  negative except as mentioned in the HPI.  Physical Exam: Vital signs BP (!) 154/91    Pulse 97    Temp 98.2 F (36.8 C)    Ht 5' 3.5" (1.613 m)    Wt 156 lb (70.8 kg)    SpO2 99%    BMI 27.20 kg/m   General:   Alert,  Well-developed, well-nourished, pleasant and cooperative in NAD Lungs:  Clear throughout to auscultation.   Heart:  Regular rate and rhythm; no murmurs, clicks, rubs,  or gallops. Abdomen:  Soft, nontender and nondistended. Normal bowel sounds.   Neuro/Psych:  Alert and cooperative. Normal mood and affect. A and O x 3   @Broady Lafoy  Simonne Maffucci, MD, [facility] [phone] (pager) 10/14/2021 8:53 AM@

## 2021-10-14 NOTE — Progress Notes (Signed)
Pt's states no medical or surgical changes since previsit or office visit. 

## 2021-10-18 ENCOUNTER — Telehealth: Payer: Self-pay | Admitting: *Deleted

## 2021-10-18 NOTE — Telephone Encounter (Signed)
°  Follow up Call-  Call back number 10/14/2021  Post procedure Call Back phone  # [phone]  Permission to leave phone message Yes  Some recent data might be hidden     Patient questions:  Do you have a fever, pain , or abdominal swelling? No. Pain Score  0 *  Have you tolerated food without any problems? No.  Have you been able to return to your normal activities? Yes.    Do you have any questions about your discharge instructions: Diet   No. Medications  No. Follow up visit  No.  Do you have questions or concerns about your Care? No.  Actions: * If pain score is 4 or above: No action needed, pain <4.

## 2021-10-19 ENCOUNTER — Encounter: Payer: Self-pay | Admitting: Internal Medicine

## 2021-11-24 ENCOUNTER — Other Ambulatory Visit: Payer: Self-pay | Admitting: Obstetrics

## 2021-11-24 DIAGNOSIS — R928 Other abnormal and inconclusive findings on diagnostic imaging of breast: Secondary | ICD-10-CM

## 2021-12-01 ENCOUNTER — Ambulatory Visit
Admission: RE | Admit: 2021-12-01 | Discharge: 2021-12-01 | Disposition: A | Discharge status: Home / Self Care | Payer: BC Managed Care – PPO | Source: Ambulatory Visit | Attending: Obstetrics | Admitting: Obstetrics

## 2021-12-01 ENCOUNTER — Ambulatory Visit: Payer: BC Managed Care – PPO

## 2021-12-01 DIAGNOSIS — R928 Other abnormal and inconclusive findings on diagnostic imaging of breast: Secondary | ICD-10-CM
